# Patient Record
Sex: Male | Born: 2000 | Race: Black or African American | Hispanic: No | Marital: Single | Smoking: Never smoker
Health system: Southern US, Community
[De-identification: ages and names within clinical notes are randomized; demographics above are authoritative.]

## PROBLEM LIST (undated history)

## (undated) DIAGNOSIS — M92521 Juvenile osteochondrosis of tibia tubercle, right leg: Secondary | ICD-10-CM

## (undated) DIAGNOSIS — M9251 Juvenile osteochondrosis of tibia and fibula, right leg: Secondary | ICD-10-CM

## (undated) HISTORY — DX: Juvenile osteochondrosis of tibia tubercle, right leg: M92.521

## (undated) HISTORY — DX: Juvenile osteochondrosis of tibia and fibula, right leg: M92.51

---
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
## 2023-07-29 DIAGNOSIS — H5213 Myopia, bilateral: Secondary | ICD-10-CM | POA: Diagnosis not present
